# Patient Record
Sex: Female | Born: 1980 | Race: Black or African American | Hispanic: No | Marital: Single
Health system: Southern US, Community
[De-identification: ages and names within clinical notes are randomized; demographics above are authoritative.]

## PROBLEM LIST (undated history)

## (undated) DIAGNOSIS — E119 Type 2 diabetes mellitus without complications: Secondary | ICD-10-CM

## (undated) DIAGNOSIS — E109 Type 1 diabetes mellitus without complications: Secondary | ICD-10-CM

---

## 2020-03-20 ENCOUNTER — Emergency Department
Admission: EM | Admit: 2020-03-20 | Discharge: 2020-03-20 | Disposition: A | Discharge status: Home / Self Care | Payer: Medicaid Other | Attending: Emergency Medicine | Admitting: Emergency Medicine

## 2020-03-20 ENCOUNTER — Other Ambulatory Visit: Payer: Self-pay

## 2020-03-20 ENCOUNTER — Emergency Department
Admission: EM | Admit: 2020-03-20 | Discharge: 2020-03-20 | Disposition: A | Discharge status: Home / Self Care | Payer: Medicaid Other

## 2020-03-20 DIAGNOSIS — Z5321 Procedure and treatment not carried out due to patient leaving prior to being seen by health care provider: Secondary | ICD-10-CM | POA: Diagnosis not present

## 2020-03-20 DIAGNOSIS — R1030 Lower abdominal pain, unspecified: Secondary | ICD-10-CM | POA: Diagnosis not present

## 2020-03-20 DIAGNOSIS — Z3A01 Less than 8 weeks gestation of pregnancy: Secondary | ICD-10-CM | POA: Insufficient documentation

## 2020-03-20 DIAGNOSIS — R102 Pelvic and perineal pain: Secondary | ICD-10-CM

## 2020-03-20 DIAGNOSIS — O26891 Other specified pregnancy related conditions, first trimester: Secondary | ICD-10-CM | POA: Diagnosis present

## 2020-03-20 HISTORY — DX: Type 2 diabetes mellitus without complications: E11.9

## 2020-03-20 HISTORY — DX: Type 1 diabetes mellitus without complications: E10.9

## 2020-03-20 LAB — COMPREHENSIVE METABOLIC PANEL
ALT: 16 U/L (ref 0–44)
AST: 20 U/L (ref 15–41)
Albumin: 4.3 g/dL (ref 3.5–5.0)
Alkaline Phosphatase: 87 U/L (ref 38–126)
Anion gap: 8 (ref 5–15)
BUN: 12 mg/dL (ref 6–20)
CO2: 26 mmol/L (ref 22–32)
Calcium: 9.3 mg/dL (ref 8.9–10.3)
Chloride: 105 mmol/L (ref 98–111)
Creatinine, Ser: 0.78 mg/dL (ref 0.44–1.00)
GFR, Estimated: >60 mL/min (ref >60)
Glucose, Bld: 113 mg/dL — ABNORMAL HIGH (ref 70–99)
Potassium: 3.5 mmol/L (ref 3.5–5.1)
Sodium: 139 mmol/L (ref 135–145)
Total Bilirubin: 1.1 mg/dL (ref 0.3–1.2)
Total Protein: 8.3 g/dL — ABNORMAL HIGH (ref 6.5–8.1)

## 2020-03-20 LAB — URINALYSIS, COMPLETE (UACMP) WITH MICROSCOPIC
Bilirubin Urine: NEGATIVE
Glucose, UA: NEGATIVE mg/dL
Ketones, ur: NEGATIVE mg/dL
Leukocytes,Ua: NEGATIVE
Nitrite: NEGATIVE
Protein, ur: 100 mg/dL — AB
RBC / HPF: >50 RBC/hpf — ABNORMAL HIGH (ref 0–5)
Specific Gravity, Urine: 1.016 (ref 1.005–1.030)
pH: 8 (ref 5.0–8.0)

## 2020-03-20 LAB — CBC
HCT: 36.4 % (ref 36.0–46.0)
Hemoglobin: 11.6 g/dL — ABNORMAL LOW (ref 12.0–15.0)
MCH: 28.6 pg (ref 26.0–34.0)
MCHC: 31.9 g/dL (ref 30.0–36.0)
MCV: 89.9 fL (ref 80.0–100.0)
Platelets: 270 10*3/uL (ref 150–400)
RBC: 4.05 MIL/uL (ref 3.87–5.11)
RDW: 14.6 % (ref 11.5–15.5)
WBC: 5 10*3/uL (ref 4.0–10.5)
nRBC: 0 % (ref 0.0–0.2)

## 2020-03-20 LAB — LIPASE, BLOOD: Lipase: 20 U/L (ref 11–51)

## 2020-03-20 MED ORDER — FENTANYL CITRATE (PF) 100 MCG/2ML IJ SOLN
50.0000 ug | Freq: Once | INTRAMUSCULAR | Status: AC
  Administered 2020-03-20: 50 ug via INTRAVENOUS
  Filled 2020-03-20: qty 2

## 2020-03-20 NOTE — ED Notes (Signed)
Patient checked on, stated she wanted to leave. Another RN removed IV.

## 2020-03-20 NOTE — ED Notes (Signed)
Patient walked to nursing station requesting to be led to the exit.Patient still with IV. Advised first nurse who will check patient.

## 2020-03-20 NOTE — ED Notes (Signed)
Pt agreeable to dose of pain meds. Kpad aware.

## 2020-03-20 NOTE — ED Triage Notes (Signed)
First nurse note: Patient to ER from home via [facility] for c/o lower abd pain/suprapubic pain since yesterday. Patient is approx [redacted] weeks pregnant, developed spotting yesterday (heavier than spotting today). Patient reports to EMS this is 6th pregnancy (has 5 living children. Also has h/o cysts on ovaries and cervical CA. Due date = August 21st, 2022.

## 2020-03-20 NOTE — ED Notes (Signed)
Per pharmacy, this RN to waste fentenyl in sharps.   Jeannett Senior, RN to witness.   Pt not in pyxis anymore to waste under pyxis.

## 2020-03-20 NOTE — ED Triage Notes (Addendum)
See first nurse note. About [redacted] weeks pregnant. Pain is more on left side. Pain started after sex last night.

## 2020-03-22 ENCOUNTER — Telehealth: Payer: Self-pay | Admitting: Emergency Medicine

## 2020-03-22 NOTE — Telephone Encounter (Signed)
Called patient due to left emergency department before provider exam to inquire about condition and follow up plans. She still has pain.  No pcp.  I explained need for exam and that she can always come back here.

## 2020-04-05 ENCOUNTER — Ambulatory Visit
Admission: RE | Admit: 2020-04-05 | Discharge: 2020-04-05 | Disposition: A | Discharge status: Home / Self Care | Payer: Medicaid Other | Source: Ambulatory Visit | Attending: Family Medicine | Admitting: Family Medicine

## 2020-04-05 ENCOUNTER — Encounter: Payer: Self-pay | Admitting: Emergency Medicine

## 2020-04-05 ENCOUNTER — Other Ambulatory Visit: Payer: Self-pay

## 2020-04-05 ENCOUNTER — Ambulatory Visit
Admission: EM | Admit: 2020-04-05 | Discharge: 2020-04-05 | Disposition: A | Discharge status: Home / Self Care | Payer: Medicaid Other | Attending: Family Medicine | Admitting: Family Medicine

## 2020-04-05 DIAGNOSIS — N939 Abnormal uterine and vaginal bleeding, unspecified: Secondary | ICD-10-CM | POA: Diagnosis present

## 2020-04-05 DIAGNOSIS — N898 Other specified noninflammatory disorders of vagina: Secondary | ICD-10-CM | POA: Insufficient documentation

## 2020-04-05 DIAGNOSIS — Z1152 Encounter for screening for COVID-19: Secondary | ICD-10-CM | POA: Diagnosis not present

## 2020-04-05 DIAGNOSIS — R102 Pelvic and perineal pain: Secondary | ICD-10-CM | POA: Insufficient documentation

## 2020-04-05 DIAGNOSIS — R140 Abdominal distension (gaseous): Secondary | ICD-10-CM | POA: Diagnosis not present

## 2020-04-05 DIAGNOSIS — R350 Frequency of micturition: Secondary | ICD-10-CM | POA: Insufficient documentation

## 2020-04-05 DIAGNOSIS — E109 Type 1 diabetes mellitus without complications: Secondary | ICD-10-CM | POA: Diagnosis present

## 2020-04-05 LAB — POCT URINALYSIS DIP (MANUAL ENTRY)
Bilirubin, UA: NEGATIVE
Glucose, UA: NEGATIVE mg/dL
Ketones, POC UA: NEGATIVE mg/dL
Leukocytes, UA: NEGATIVE
Nitrite, UA: NEGATIVE
Protein Ur, POC: NEGATIVE mg/dL
Spec Grav, UA: 1.02 (ref 1.010–1.025)
Urobilinogen, UA: 0.2 E.U./dL
pH, UA: 5.5 (ref 5.0–8.0)

## 2020-04-05 LAB — POCT URINE PREGNANCY: Preg Test, Ur: NEGATIVE

## 2020-04-05 MED ORDER — FLUCONAZOLE 150 MG PO TABS: 150.0000 mg | ORAL_TABLET | Freq: Every day | ORAL | 0 refills | Status: AC

## 2020-04-05 MED ORDER — METRONIDAZOLE 0.75 % VA GEL: 1.0000 | Freq: Every day | VAGINAL | 0 refills | Status: AC

## 2020-04-05 MED ORDER — INSULIN DETEMIR 100 UNIT/ML FLEXPEN: 30.0000 [IU] | PEN_INJECTOR | Freq: Every day | SUBCUTANEOUS | 11 refills | Status: AC

## 2020-04-05 MED ORDER — METRONIDAZOLE 500 MG PO TABS: 500.0000 mg | ORAL_TABLET | Freq: Two times a day (BID) | ORAL | 0 refills | Status: DC

## 2020-04-05 MED ORDER — INSULIN ASPART 100 UNIT/ML FLEXPEN: 1.0000 [IU] | PEN_INJECTOR | Freq: Two times a day (BID) | SUBCUTANEOUS | 11 refills | Status: AC

## 2020-04-05 MED ORDER — HYDROCODONE-ACETAMINOPHEN 5-325 MG PO TABS: 1.0000 | ORAL_TABLET | Freq: Four times a day (QID) | ORAL | 0 refills | Status: DC | PRN

## 2020-04-05 NOTE — Discharge Instructions (Addendum)
You are going for an ultrasound today at 1:45, get there about 2:00 pm at [facility].  I will call you when I get the results We are sending a COVID swab for COVID testing and also sending a self swab for testing for BV, yeast, STDs. I have refilled your diabetes medication I am can you contact for primary care and for OB/GYN for follow-up

## 2020-04-05 NOTE — ED Triage Notes (Signed)
Pt presents today with lower abd pain, lower back pain and has sores to vaginal area. She was recently in ED for UTI and left before being discharged. Was dx with UTI and BV. She did not get antibiotic and symptoms have returned.

## 2020-04-05 NOTE — ED Provider Notes (Signed)
Renaldo Fiddler    CSN: [id number] Arrival date & time: 04/05/20  1111      History   Chief Complaint Chief Complaint  Patient presents with  . Abdominal Pain  . Back Pain    HPI Alisha Mitchell is a 40 y.o. female.   Patient is a 40 year old female with past medical history of type 1 diabetes.  She presents today with multiple complaints.  First complaint being lower abdominal pain, pressure, lower back pain.  This has been ongoing issue for the past couple months.  Symptoms are worsened.  She has had urinary frequency and incontinence.  She is also having irregular menstrual cycles with abnormal bleeding and missed periods.  Abdominal area and pelvic area feel very bloated and distended.  No dysuria, hematuria, fevers.  Is having vaginal discharge, itching and irritation.  Dyspareunia. Patient's last menstrual period was 02/04/2020. Concern for BV and yeast infection.  Patient is currently out of her diabetes medication.  Recently obtained health insurance and needs refills on her insulin.  Reports blood sugars have been up and down.  She was also exposed to covid and would like covid testing.       Past Medical History:  Diagnosis Date  . Diabetes mellitus without complication (HCC)   . Type 1 diabetes (HCC)     There are no problems to display for this patient.   History reviewed. No pertinent surgical history.  OB History    Gravida  1   Para      Term      Preterm      AB      Living        SAB      IAB      Ectopic      Multiple      Live Births               Home Medications    Prior to Admission medications   Medication Sig Start Date End Date Taking? Authorizing Provider  albuterol (VENTOLIN HFA) 108 (90 Base) MCG/ACT inhaler Inhale into the lungs. 09/16/19  Yes [provider]  fluconazole (DIFLUCAN) 150 MG tablet Take 1 tablet (150 mg total) by mouth daily. 04/05/20  Yes Hollie Wojahn A, NP  HYDROcodone-acetaminophen  (NORCO/VICODIN) 5-325 MG tablet Take 1-2 tablets by mouth every 6 (six) hours as needed. 04/05/20  Yes Kyndall Amero A, NP  insulin aspart (NOVOLOG) 100 UNIT/ML FlexPen Inject 1 Units into the skin 2 (two) times daily. 04/05/20  Yes Ericberto Padget A, NP  insulin detemir (LEVEMIR) 100 UNIT/ML FlexPen Inject 30 Units into the skin daily. 04/05/20  Yes Mikaili Flippin A, NP  metroNIDAZOLE (METROGEL) 0.75 % vaginal gel Place 1 Applicatorful vaginally at bedtime for 5 days. 04/05/20 04/10/20 Yes Daisean Brodhead A, NP  cyclobenzaprine (FLEXERIL) 10 MG tablet Take by mouth.    [provider]    Family History History reviewed. No pertinent family history.  Social History Social History   Tobacco Use  . Smoking status: Passive Smoke Exposure - Never Smoker  . Smokeless tobacco: Never Used  Substance Use Topics  . Alcohol use: Not Currently  . Drug use: Never     Allergies   Coconut (cocos nucifera) allergy skin test, Latex, Orange juice [orange oil], and Penicillins   Review of Systems Review of Systems   Physical Exam Triage Vital Signs ED Triage Vitals  Enc Vitals Group     BP 04/05/20 1126 139/78  Pulse Rate 04/05/20 1126 80     Resp 04/05/20 1126 18     Temp 04/05/20 1126 98.2 F (36.8 C)     Temp Source 04/05/20 1126 Oral     SpO2 04/05/20 1126 98 %     Weight 04/05/20 1127 130 lb 1.1 oz (59 kg)     Height --      Head Circumference --      Peak Flow --      Pain Score 04/05/20 1127 6     Pain Loc --      Pain Edu? --      Excl. in GC? --    No data found.  Updated Vital Signs BP 139/78 (BP Location: Left Arm)   Pulse 80   Temp 98.2 F (36.8 C) (Oral)   Resp 18   Wt 130 lb 1.1 oz (59 kg)   LMP 02/04/2020   SpO2 98%   Breastfeeding Unknown   BMI 26.27 kg/m   Visual Acuity Right Eye Distance:   Left Eye Distance:   Bilateral Distance:    Right Eye Near:   Left Eye Near:    Bilateral Near:     Physical Exam Vitals and nursing note reviewed.   Constitutional:      General: She is not in acute distress.    Appearance: Normal appearance. She is not ill-appearing, toxic-appearing or diaphoretic.  HENT:     Head: Normocephalic.     Nose: Nose normal.     Mouth/Throat:     Pharynx: Oropharynx is clear.  Eyes:     Conjunctiva/sclera: Conjunctivae normal.  Pulmonary:     Effort: Pulmonary effort is normal.  Abdominal:     General: Abdomen is protuberant. There is distension.     Palpations: Abdomen is soft.     Tenderness: There is abdominal tenderness.     Comments: Distended abdomen. Very firm. TTP generalized.   Genitourinary:    Vagina: Vaginal discharge present.     Comments: White vaginal discharge noted to external vaginal area. Small sore to right labia majora. Skin tag.  Internal exam deferred.  Musculoskeletal:        General: Normal range of motion.     Cervical back: Normal range of motion.  Skin:    General: Skin is warm and dry.     Findings: No rash.  Neurological:     Mental Status: She is alert.  Psychiatric:        Mood and Affect: Mood normal.      UC Treatments / Results  Labs (all labs ordered are listed, but only abnormal results are displayed) Labs Reviewed  POCT URINALYSIS DIP (MANUAL ENTRY) - Abnormal; Notable for the following components:      Result Value   Blood, UA small (*)    All other components within normal limits  NOVEL CORONAVIRUS, NAA  POCT URINE PREGNANCY  CERVICOVAGINAL ANCILLARY ONLY    EKG   Radiology No results found.  Procedures Procedures (including critical care time)  Medications Ordered in UC Medications - No data to display  Initial Impression / Assessment and Plan / UC Course  I have reviewed the triage vital signs and the nursing notes.  Pertinent labs & imaging results that were available during my care of the patient were reviewed by me and considered in my medical decision making (see chart for details).     Pelvic pain with abdominal  bloating.  This has been ongoing and worse.  Distended form abdomen. There is concern today for fibroid tumor. Pt appears to be approximated 6 months pregnant. Pregnancy test negative. Sending for ultrasound.  Contact given for OB/GYN for follow up.  Due to the vaginal discharge and concern we will treat for BV and yeast.  Swab sent for testing.  Urine did not show infection today.  Hydrocodone for pain as needed.   Exposure to covid covid test pending.   Type 1 diabetes Refilled her novolog and Levemir pens.  Contact given for primary care for follow up.   Final Clinical Impressions(s) / UC Diagnoses   Final diagnoses:  Abdominal bloating  Vaginal discharge  Urinary frequency  Abnormal uterine bleeding (AUB)  Type 1 diabetes mellitus without complication (HCC)  Pelvic pain  Encounter for screening for COVID-19     Discharge Instructions     You are going for an ultrasound today at 1:45, get there about 2:00 pm at [facility].  I will call you when I get the results We are sending a COVID swab for COVID testing and also sending a self swab for testing for BV, yeast, STDs. I have refilled your diabetes medication I am can you contact for primary care and for OB/GYN for follow-up      ED Prescriptions    Medication Sig Dispense Auth. Provider   insulin aspart (NOVOLOG) 100 UNIT/ML FlexPen Inject 1 Units into the skin 2 (two) times daily. 15 mL Ayinde Swim A, NP   insulin detemir (LEVEMIR) 100 UNIT/ML FlexPen Inject 30 Units into the skin daily. 15 mL Yanin Muhlestein A, NP   metroNIDAZOLE (FLAGYL) 500 MG tablet  (Status: Discontinued) Take 1 tablet (500 mg total) by mouth 2 (two) times daily. 14 tablet Jonathan Corpus A, NP   fluconazole (DIFLUCAN) 150 MG tablet Take 1 tablet (150 mg total) by mouth daily. 2 tablet Brandon Scarbrough A, NP   HYDROcodone-acetaminophen (NORCO/VICODIN) 5-325 MG tablet Take 1-2 tablets by mouth every 6 (six) hours as needed. 12 tablet Akasha Melena A, NP    metroNIDAZOLE (METROGEL) 0.75 % vaginal gel Place 1 Applicatorful vaginally at bedtime for 5 days. 50 g Dahlia Byes A, NP     I have reviewed the PDMP during this encounter.   Dahlia Byes A, NP 04/05/20 1409

## 2020-04-06 LAB — CERVICOVAGINAL ANCILLARY ONLY
Bacterial Vaginitis (gardnerella): NEGATIVE
Candida Glabrata: NEGATIVE
Candida Vaginitis: NEGATIVE
Chlamydia: NEGATIVE
Comment: NEGATIVE
Comment: NEGATIVE
Comment: NEGATIVE
Comment: NEGATIVE
Comment: NEGATIVE
Comment: NORMAL
Neisseria Gonorrhea: NEGATIVE
Trichomonas: NEGATIVE

## 2020-04-07 LAB — NOVEL CORONAVIRUS, NAA: SARS-CoV-2, NAA: NOT DETECTED

## 2020-04-07 LAB — SARS-COV-2, NAA 2 DAY TAT

## 2020-07-31 ENCOUNTER — Observation Stay
Admission: EM | Admit: 2020-07-31 | Discharge: 2020-07-31 | Disposition: A | Discharge status: Home / Self Care | Payer: Medicaid Other | Attending: Obstetrics and Gynecology | Admitting: Obstetrics and Gynecology

## 2020-07-31 DIAGNOSIS — O30102 Triplet pregnancy, unspecified number of placenta and unspecified number of amniotic sacs, second trimester: Principal | ICD-10-CM | POA: Insufficient documentation

## 2020-07-31 DIAGNOSIS — Z3A24 24 weeks gestation of pregnancy: Secondary | ICD-10-CM | POA: Diagnosis not present

## 2020-07-31 DIAGNOSIS — O4692 Antepartum hemorrhage, unspecified, second trimester: Secondary | ICD-10-CM | POA: Diagnosis not present

## 2020-07-31 DIAGNOSIS — Z91040 Latex allergy status: Secondary | ICD-10-CM | POA: Diagnosis not present

## 2020-07-31 LAB — PREGNANCY, URINE: Preg Test, Ur: POSITIVE — AB

## 2020-07-31 MED ORDER — ACETAMINOPHEN 325 MG PO TABS: 650.0000 mg | ORAL_TABLET | ORAL | Status: DC | PRN

## 2020-07-31 NOTE — H&P (Addendum)
Obstetric H&P   Chief Complaint: Bleeding, triplet pregnancy  Prenatal Care Provider: Unclear  History of Present Illness: 40 y.o. G2P0 [redacted]w[redacted]d by 11/16/2020, by Last Menstrual Period presenting to L&D reporting a previously diagnosed triplet pregnancy with abdominal pain and vaginal bleeding.  She also gave a second due date in September.  Reports prior care in [facility] but on contacting that facility they do not provide prenatal care.  She was noted to have vaginal bleeding on presentation.  Ultrasound 04/05/2020 showed a normal uterus and no pregnancy. So even if that ultrasound was used as her LMP she would be at most [redacted] week gestation.  She refused to get on a gown and appeared to be wearing something underneath her cloths.  She declined ultrasound evaluation as I presented to perform a bedside ultrasound.  She declined exam or bedside ultrasound and elected to sign out AMA    Review of Systems: 10 point review of systems negative unless otherwise noted in HPI  Past Medical History: Patient Active Problem List   Diagnosis Date Noted  . Labor and delivery indication for care or intervention 07/31/2020    Past Surgical History: No past surgical history on file.  Past Obstetric History: # 1 - Date: None, Sex: None, Weight: None, GA: None, Delivery: None, Apgar1: None, Apgar5: None, Living: None, Birth Comments: None  # 2 - Date: None, Sex: None, Weight: None, GA: None, Delivery: None, Apgar1: None, Apgar5: None, Living: None, Birth Comments: None   Past Gynecologic History:  Family History: No family history on file.  Social History: Social History   Socioeconomic History  . Marital status: Single    Spouse name: Not on file  . Number of children: Not on file  . Years of education: Not on file  . Highest education level: Not on file  Occupational History  . Not on file  Tobacco Use  . Smoking status: Passive Smoke Exposure - Never Smoker  . Smokeless  tobacco: Never Used  Substance and Sexual Activity  . Alcohol use: Not Currently  . Drug use: Never  . Sexual activity: Yes  Other Topics Concern  . Not on file  Social History Narrative  . Not on file   Social Determinants of Health   Financial Resource Strain: Not on file  Food Insecurity: Not on file  Transportation Needs: Not on file  Physical Activity: Not on file  Stress: Not on file  Social Connections: Not on file  Intimate Partner Violence: Not on file    Medications: Prior to Admission medications   Medication Sig Start Date End Date Taking? Authorizing Provider  albuterol (VENTOLIN HFA) 108 (90 Base) MCG/ACT inhaler Inhale into the lungs. 09/16/19   [provider]  cyclobenzaprine (FLEXERIL) 10 MG tablet Take by mouth.    [provider]  fluconazole (DIFLUCAN) 150 MG tablet Take 1 tablet (150 mg total) by mouth daily. 04/05/20   Dahlia Byes A, NP  HYDROcodone-acetaminophen (NORCO/VICODIN) 5-325 MG tablet Take 1-2 tablets by mouth every 6 (six) hours as needed. 04/05/20   Dahlia Byes A, NP  insulin aspart (NOVOLOG) 100 UNIT/ML FlexPen Inject 1 Units into the skin 2 (two) times daily. 04/05/20   Dahlia Byes A, NP  insulin detemir (LEVEMIR) 100 UNIT/ML FlexPen Inject 30 Units into the skin daily. 04/05/20   Janace Aris, NP    Allergies: Allergies  Allergen Reactions  . Coconut (Cocos Nucifera) Allergy Skin Test   . Latex   .  Orange Juice [Orange Oil]   . Penicillins     Physical Exam: Vitals: Last menstrual period 02/10/2020, unknown if currently breastfeeding.  FHT:  Declined  Declined physical exam  Labs: No results found for this or any previous visit (from the past 24 hour(s)).  Assessment: 40 y.o. G2P0 [redacted]w[redacted]d by 11/16/2020, by Last Menstrual Period suspected pseudocyesis   Plan: 1) Pseudocyesis - declined ultrasound evaluation, pregnancy test was sent prior to patient signing out AMA  2) Fetus - declined evaluation  3) Disposition  - signed out AMA  Vena Austria, MD, [facility], [facility] 07/31/2020, 10:54 AM    Addendum:  Urine pregnancy test was positive.   Vena Austria, MD, [facility], [facility] 07/31/2020, 11:15 AM

## 2020-07-31 NOTE — Discharge Summary (Signed)
RN at the bedside to triage patient who reports she is "pregnant with triplets". RN gave the patient a urine specimen cup and a gown and asked her to change her clothes. Pt reports needing extra time because she has had diarrhea all morning. Pt exits the restroom still fully clothed in her clothes and hands RN the urine specimen cup that appears to be bloody. RN asks the pt if she is bleeding, in which the patient proceeds to show the patient a blood saturated peri pad. Pt states her "urine has been bloody for a week and a half and it burns when I pee and my husband thinks I have a UTI". She also tells RN that "my husband called and said we are at the wrong hospital, and we should be at [facility] in [place]". Pt states "I want to leave [facility]". Provider at the bedside to do a bedside ultrasound to provide a medical screening for the patient. Pt refused to let the provider do a bedside ultrasound. RN to explain to pt the right to medical screening and that if she were to leave, she would be leaving against medical advice. Pt verbalized understanding. Pt signed that she understood the right to medical screening and that she wanted to leave against medical advice. Pt then wheeled down to the ED by our unit tech and RN.

## 2020-12-24 ENCOUNTER — Telehealth: Payer: Medicaid Other | Admitting: Emergency Medicine

## 2020-12-24 ENCOUNTER — Ambulatory Visit: Payer: Self-pay

## 2020-12-24 ENCOUNTER — Telehealth: Payer: Self-pay | Admitting: Surgery

## 2020-12-24 DIAGNOSIS — A6000 Herpesviral infection of urogenital system, unspecified: Secondary | ICD-10-CM

## 2020-12-24 MED ORDER — VALACYCLOVIR HCL 1 G PO TABS: 2000.0000 mg | ORAL_TABLET | Freq: Two times a day (BID) | ORAL | 0 refills | Status: AC

## 2020-12-24 MED ORDER — LIDOCAINE 5 % EX OINT: 1.0000 "application " | TOPICAL_OINTMENT | CUTANEOUS | 0 refills | Status: AC | PRN

## 2020-12-24 NOTE — Progress Notes (Signed)
Virtual Visit Consent   Alisha Mitchell, you are scheduled for a virtual visit with a [facility] provider today.     Just as with appointments in the office, your consent must be obtained to participate.  Your consent will be active for this visit and any virtual visit you may have with one of our providers in the next 365 days.     If you have a MyChart account, a copy of this consent can be sent to you electronically.  All virtual visits are billed to your insurance company just like a traditional visit in the office.    As this is a virtual visit, video technology does not allow for your provider to perform a traditional examination.  This may limit your provider's ability to fully assess your condition.  If your provider identifies any concerns that need to be evaluated in person or the need to arrange testing (such as labs, EKG, etc.), we will make arrangements to do so.     Although advances in technology are sophisticated, we cannot ensure that it will always work on either your end or our end.  If the connection with a video visit is poor, the visit may have to be switched to a telephone visit.  With either a video or telephone visit, we are not always able to ensure that we have a secure connection.     I need to obtain your verbal consent now.   Are you willing to proceed with your visit today?    Kelsy Polack has provided verbal consent on 12/24/2020 for a virtual visit video.   Roxy Horseman, PA-C   Date: 12/24/2020 2:57 PM   Virtual Visit via Video Note   I, Roxy Horseman, connected with  Alisha Mitchell  ([id number], 12/07/1980) on 12/24/20 at  3:00 PM EDT by a video-enabled telemedicine application and verified that I am speaking with the correct person using two identifiers.  Location: Patient: Virtual Visit Location Patient: Mobile Provider: Virtual Visit Location Provider: Home Office   I discussed the limitations of evaluation and management by telemedicine and  the availability of in person appointments. The patient expressed understanding and agreed to proceed.    Poor connection with video.  Switched to telephone.  History of Present Illness: Alisha Mitchell is a 40 y.o. who identifies as a female who was assigned female at birth, and is being seen today for genital herpes and toothache.  She has hx of genital herpes, but doesn't have any medications to treat it.  Has also had good relief with lidocaine jelly.    She also complains of a toothache and broken tooth.  Has tried treatment with OTC meds and antibiotics, but no relief.  HPI: HPI  Problems: There are no problems to display for this patient.   Allergies: Not on File Medications: No current outpatient medications on file.  Observations/Objective: Patient is well-developed, well-nourished in no acute distress.  Walking outside Head is normocephalic, atraumatic.  No labored breathing.  Speech is clear and coherent with logical content.  Patient is alert and oriented at baseline.    Assessment and Plan: 1. Genital herpes simplex, unspecified site Valtrex 2000mg  BID x 1 day Lidocaine jelly Dental follow-up for toothache  Follow Up Instructions: I discussed the assessment and treatment plan with the patient. The patient was provided an opportunity to ask questions and all were answered. The patient agreed with the plan and demonstrated an understanding of the instructions.  A copy of instructions were  sent to the patient via MyChart unless otherwise noted below.     The patient was advised to call back or seek an in-person evaluation if the symptoms worsen or if the condition fails to improve as anticipated.  Time:  I spent 12 minutes with the patient via telehealth technology discussing the above problems/concerns.    Roxy Horseman, PA-C

## 2020-12-24 NOTE — Telephone Encounter (Signed)
Patient called ED RNCM because prescriptions were not received at her local [facility]. ED RNCM reviewed chart and noted prescription which were called in to [facility] in [place], patient was updated.

## 2020-12-26 ENCOUNTER — Emergency Department
Admission: EM | Admit: 2020-12-26 | Discharge: 2020-12-26 | Disposition: A | Discharge status: Home / Self Care | Payer: Medicaid Other | Attending: Emergency Medicine | Admitting: Emergency Medicine

## 2020-12-26 ENCOUNTER — Encounter: Payer: Self-pay | Admitting: Emergency Medicine

## 2020-12-26 ENCOUNTER — Ambulatory Visit: Payer: Medicaid Other

## 2020-12-26 ENCOUNTER — Other Ambulatory Visit: Payer: Self-pay

## 2020-12-26 DIAGNOSIS — K0889 Other specified disorders of teeth and supporting structures: Secondary | ICD-10-CM | POA: Insufficient documentation

## 2020-12-26 DIAGNOSIS — E109 Type 1 diabetes mellitus without complications: Secondary | ICD-10-CM | POA: Insufficient documentation

## 2020-12-26 DIAGNOSIS — Z91040 Latex allergy status: Secondary | ICD-10-CM | POA: Diagnosis not present

## 2020-12-26 DIAGNOSIS — Z7722 Contact with and (suspected) exposure to environmental tobacco smoke (acute) (chronic): Secondary | ICD-10-CM | POA: Insufficient documentation

## 2020-12-26 DIAGNOSIS — Z794 Long term (current) use of insulin: Secondary | ICD-10-CM | POA: Diagnosis not present

## 2020-12-26 MED ORDER — OXYCODONE-ACETAMINOPHEN 5-325 MG PO TABS: 1.0000 | ORAL_TABLET | ORAL | 0 refills | Status: AC | PRN

## 2020-12-26 MED ORDER — ONDANSETRON 4 MG PO TBDP: 4.0000 mg | ORAL_TABLET | Freq: Three times a day (TID) | ORAL | 0 refills | Status: AC | PRN

## 2020-12-26 NOTE — ED Provider Notes (Signed)
[facility] Emergency Department Provider Note  ____________________________________________   Event Date/Time   First MD Initiated Contact with Patient 12/26/20 1359     (approximate)  I have reviewed the triage vital signs and the nursing notes.   HISTORY  Chief Complaint Dental Pain    HPI Alisha Mitchell is a 40 y.o. female presents emergency department complaint of dental pain.  Patient was seen at her dentist office but he stated he could not write for narcotics.  States that she has an infection in the tooth that may have gone into the bone.  Placed her on amoxicillin.  She is to follow-up with a oral surgeon next week to have the areas addressed.  States she has diabetes and has been unable to eat or drink due to the pain.  Does not feel like she is going into DKA.  Past Medical History:  Diagnosis Date   Diabetes mellitus without complication (HCC)    Type 1 diabetes (HCC)     Patient Active Problem List   Diagnosis Date Noted   Labor and delivery indication for care or intervention 07/31/2020    History reviewed. No pertinent surgical history.  Prior to Admission medications   Medication Sig Start Date End Date Taking? Authorizing Provider  ondansetron (ZOFRAN-ODT) 4 MG disintegrating tablet Take 1 tablet (4 mg total) by mouth every 8 (eight) hours as needed. 12/26/20  Yes Kemper Heupel, Roselyn Bering, PA-C  oxyCODONE-acetaminophen (PERCOCET) 5-325 MG tablet Take 1 tablet by mouth every 4 (four) hours as needed for severe pain. 12/26/20 12/26/21 Yes Randolf Sansoucie, Roselyn Bering, PA-C  albuterol (VENTOLIN HFA) 108 (90 Base) MCG/ACT inhaler Inhale into the lungs. 09/16/19   [provider]  cyclobenzaprine (FLEXERIL) 10 MG tablet Take by mouth.    [provider]  fluconazole (DIFLUCAN) 150 MG tablet Take 1 tablet (150 mg total) by mouth daily. 04/05/20   Dahlia Byes A, NP  insulin aspart (NOVOLOG) 100 UNIT/ML FlexPen Inject 1 Units into the skin 2 (two)  times daily. 04/05/20   Dahlia Byes A, NP  insulin detemir (LEVEMIR) 100 UNIT/ML FlexPen Inject 30 Units into the skin daily. 04/05/20   Janace Aris, NP    Allergies Coconut (cocos nucifera) allergy skin test, Latex, Orange juice [orange oil], and Penicillins  History reviewed. No pertinent family history.  Social History Social History   Tobacco Use   Smoking status: Passive Smoke Exposure - Never Smoker   Smokeless tobacco: Never  Substance Use Topics   Alcohol use: Not Currently   Drug use: Never    Review of Systems  Constitutional: No fever/chills Eyes: No visual changes. ENT: No sore throat. Respiratory: Denies cough Cardiovascular: Denies chest pain Gastrointestinal: Denies abdominal pain Genitourinary: Negative for dysuria. Musculoskeletal: Negative for back pain. Skin: Negative for rash. Psychiatric: no mood changes,     ____________________________________________   PHYSICAL EXAM:  VITAL SIGNS: ED Triage Vitals  Enc Vitals Group     BP 12/26/20 1216 131/89     Pulse Rate 12/26/20 1216 80     Resp 12/26/20 1216 16     Temp 12/26/20 1216 98.4 F (36.9 C)     Temp Source 12/26/20 1216 Oral     SpO2 12/26/20 1216 98 %     Weight 12/26/20 1215 130 lb 1.1 oz (59 kg)     Height 12/26/20 1215 4\' 11"  (1.499 m)     Head Circumference --      Peak Flow --  Pain Score 12/26/20 1214 10     Pain Loc --      Pain Edu? --      Excl. in GC? --     Constitutional: Alert and oriented. Well appearing and in no acute distress. Eyes: Conjunctivae are normal.  Head: Atraumatic.  Left mandible tender to palpation, no swelling noted Nose: No congestion/rhinnorhea.  Mouth/Throat: Mucous membranes are moist.  Positive for poor dentition Neck:  supple no lymphadenopathy noted Cardiovascular: Normal rate, regular rhythm. Heart sounds are normal Respiratory: Normal respiratory effort.  No retractions, lungs c t a  GU: deferred Musculoskeletal: FROM all  extremities, warm and well perfused Neurologic:  Normal speech and language.  Skin:  Skin is warm, dry and intact. No rash noted. Psychiatric: Mood and affect are normal. Speech and behavior are normal.  ____________________________________________   LABS (all labs ordered are listed, but only abnormal results are displayed)  Labs Reviewed - No data to display ____________________________________________   ____________________________________________  RADIOLOGY    ____________________________________________   PROCEDURES  Procedure(s) performed: No  Procedures    ____________________________________________   INITIAL IMPRESSION / ASSESSMENT AND PLAN / ED COURSE  Pertinent labs & imaging results that were available during my care of the patient were reviewed by me and considered in my medical decision making (see chart for details).   The patient is a 40 year old female presents emergency department with dental pain.  See HPI.  Physical exam shows patient appears stable.  Patient has a prescription for amoxicillin from her dentist.  I sent in a prescription for Percocet.  She had a low PDMP score and had not had any narcotics since January 2021.  She is to keep her dental appointment with the oral surgeon.  Return emergency department worsening.  Discharged in stable condition.     Alisha Mitchell was evaluated in Emergency Department on 12/26/2020 for the symptoms described in the history of present illness. She was evaluated in the context of the global COVID-19 pandemic, which necessitated consideration that the patient might be at risk for infection with the SARS-CoV-2 virus that causes COVID-19. Institutional protocols and algorithms that pertain to the evaluation of patients at risk for COVID-19 are in a state of rapid change based on information released by regulatory bodies including the CDC and federal and state organizations. These policies and algorithms were  followed during the patient's care in the ED.    As part of my medical decision making, I reviewed the following data within the electronic MEDICAL RECORD NUMBER Nursing notes reviewed and incorporated, Old chart reviewed, Notes from prior ED visits, and [place] Controlled Substance Database  ____________________________________________   FINAL CLINICAL IMPRESSION(S) / ED DIAGNOSES  Final diagnoses:  Pain, dental      NEW MEDICATIONS STARTED DURING THIS VISIT:  Discharge Medication List as of 12/26/2020  2:00 PM     START taking these medications   Details  ondansetron (ZOFRAN-ODT) 4 MG disintegrating tablet Take 1 tablet (4 mg total) by mouth every 8 (eight) hours as needed., Starting Wed 12/26/2020, Normal    oxyCODONE-acetaminophen (PERCOCET) 5-325 MG tablet Take 1 tablet by mouth every 4 (four) hours as needed for severe pain., Starting Wed 12/26/2020, Until Thu 12/26/2021 at 2359, Normal         Note:  This document was prepared using Dragon voice recognition software and may include unintentional dictation errors.    Faythe Ghee, PA-C 12/26/20 1438    Dionne Bucy, MD 12/26/20 1525

## 2020-12-26 NOTE — ED Triage Notes (Signed)
Pt comes into the ED via POV c/o left lower dental pain x 10 days.  Pt in NAD at this time with even and unlabored respirations.

## 2022-04-12 IMAGING — US US PELVIS COMPLETE WITH TRANSVAGINAL
1 series · 14 of 25 positions shown · non-contrast
Comparison: None.

CLINICAL DATA: Irregular, heavy menses and bloating for the past
several months. Evaluate for fibroids.

EXAM:
TRANSABDOMINAL AND TRANSVAGINAL ULTRASOUND OF PELVIS
TECHNIQUE: Both transabdominal and transvaginal ultrasound examinations of the
pelvis were performed. Transabdominal technique was performed for
global imaging of the pelvis including uterus, ovaries, adnexal
regions, and pelvic cul-de-sac. It was necessary to proceed with
endovaginal exam following the transabdominal exam to visualize the
endometrium and ovaries.

[Series 1: us pelvis complete with transvaginal · 0.22mm/px · 99 acquisitions, 14 frames shown]
[im 1/99]
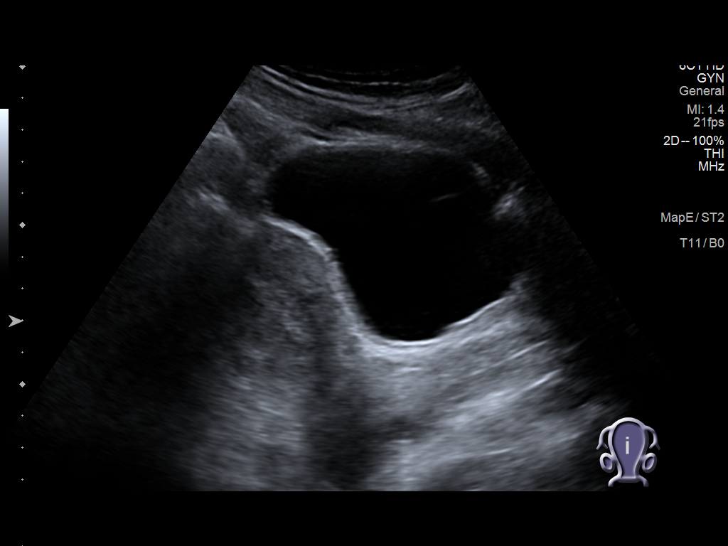
[im 9/99]
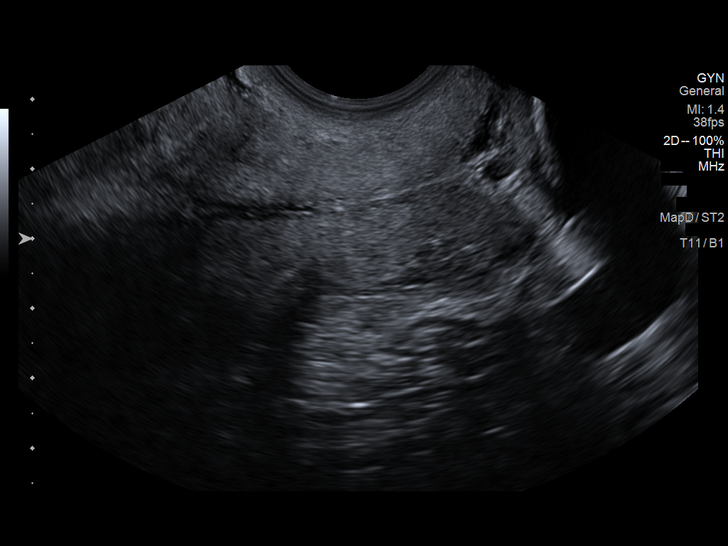
[im 17/99]
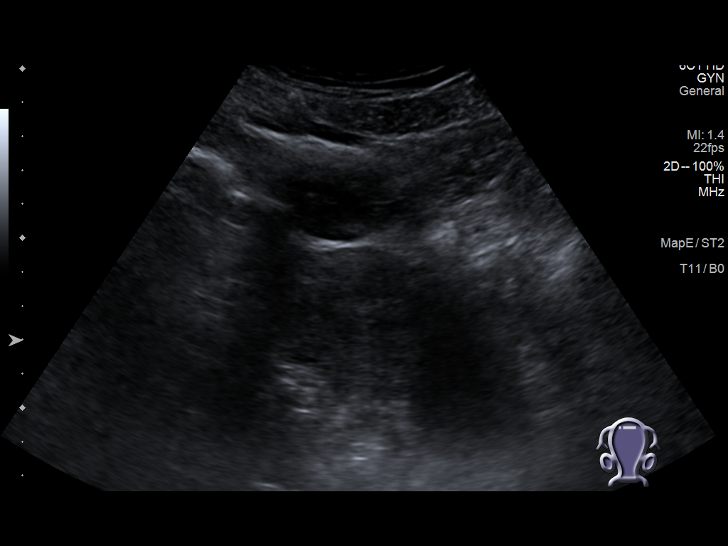
[im 25/99]
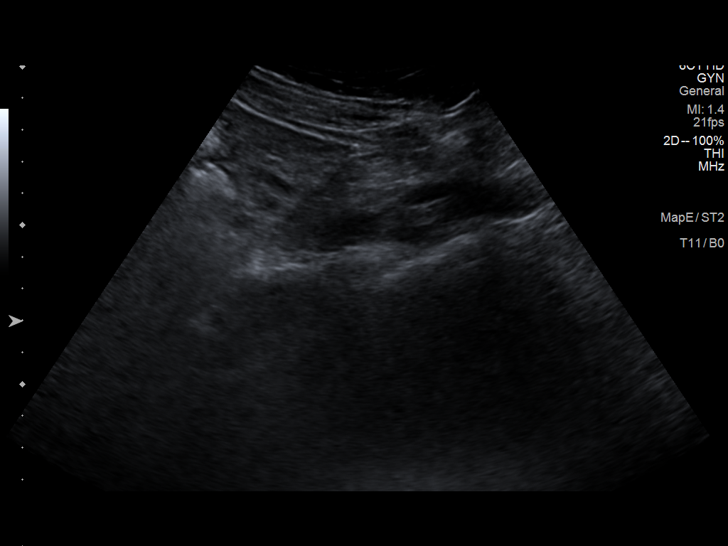
[im 33/99]
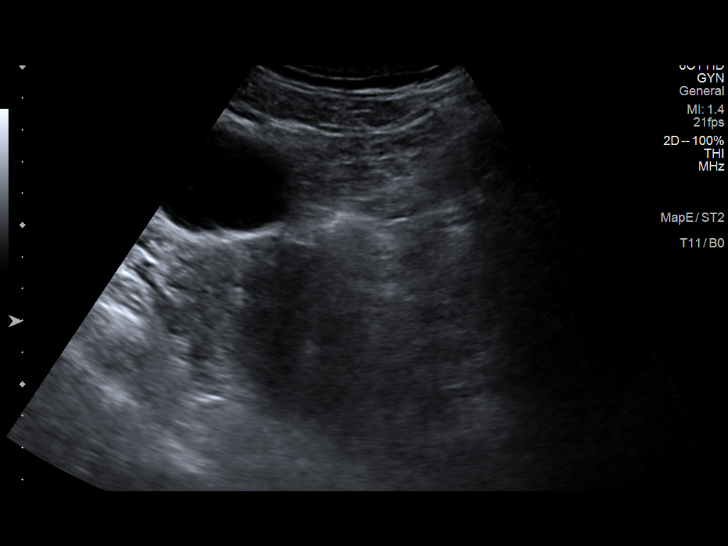
[im 37/99]
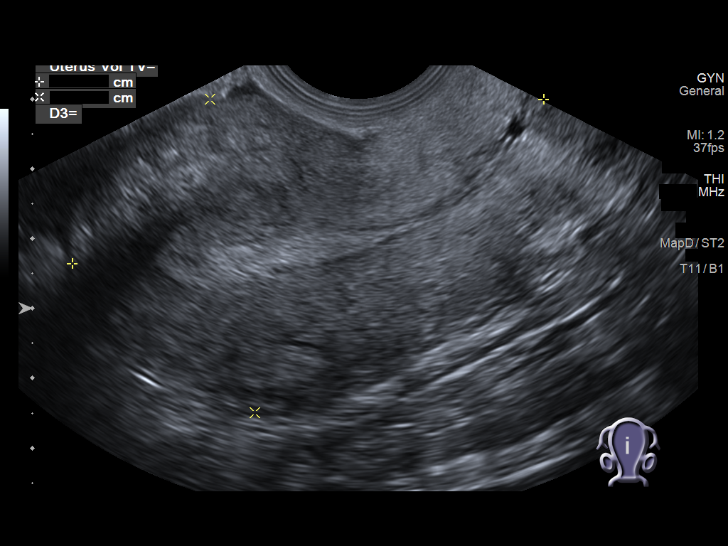
[im 45/99]
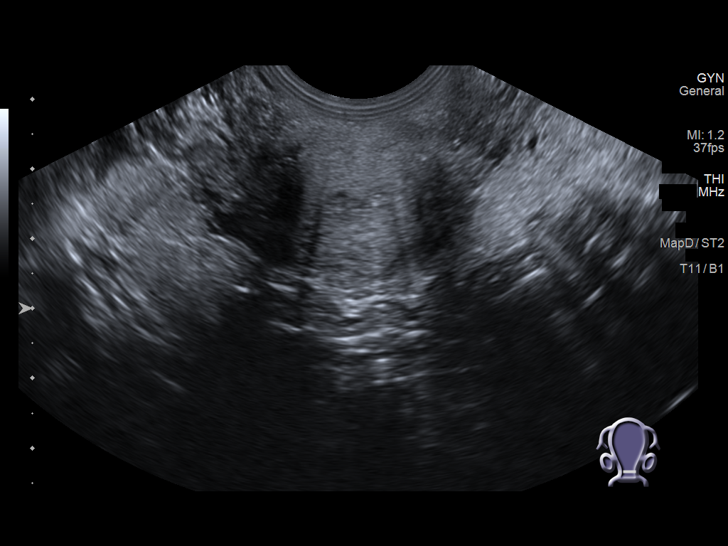
[im 54/99]
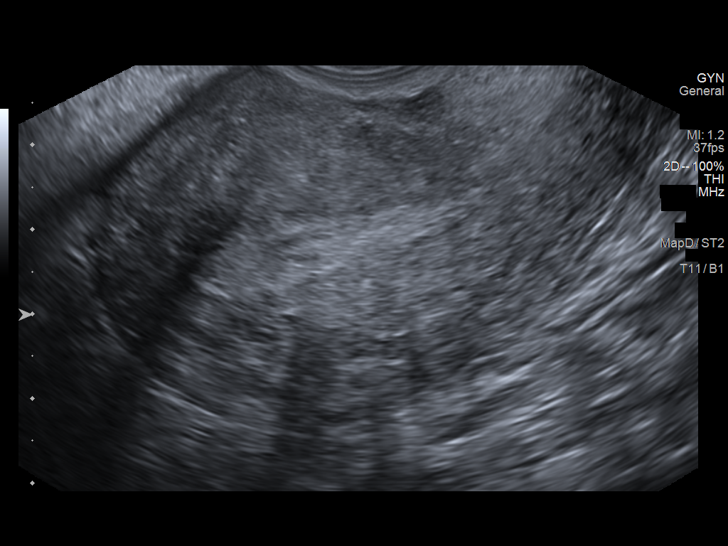
[im 62/99]
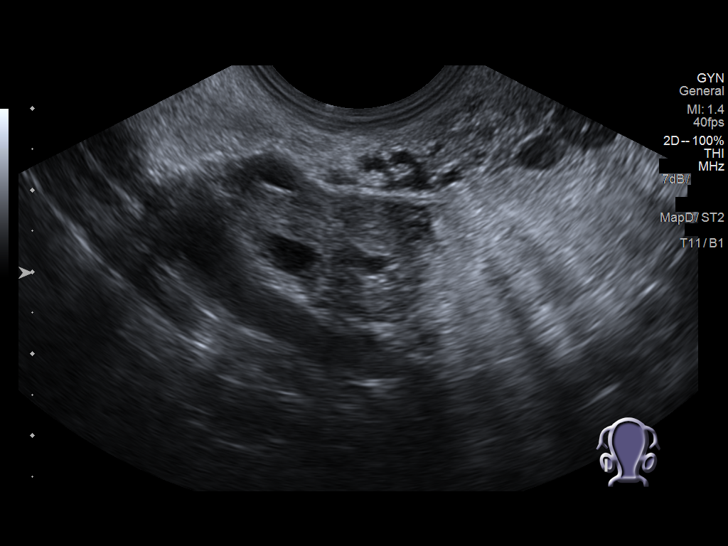
[im 66/99]
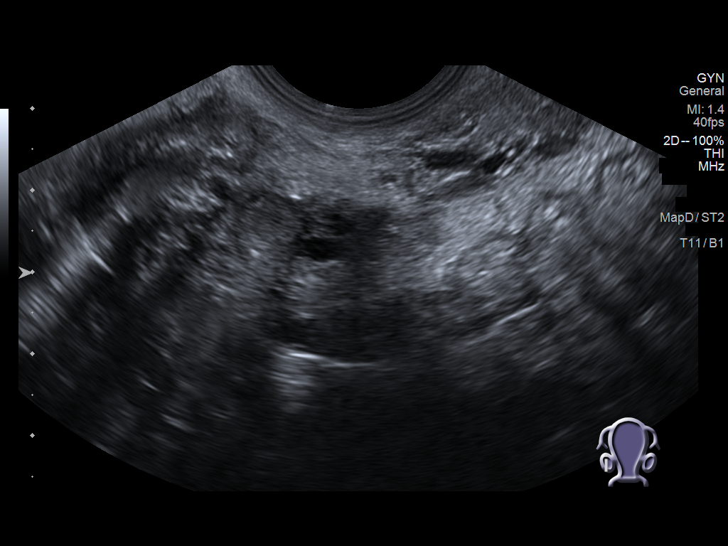
[im 74/99]
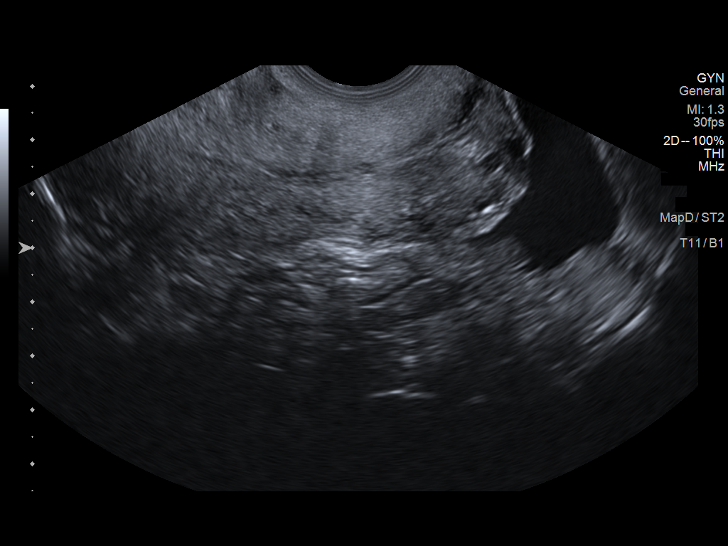
[im 82/99]
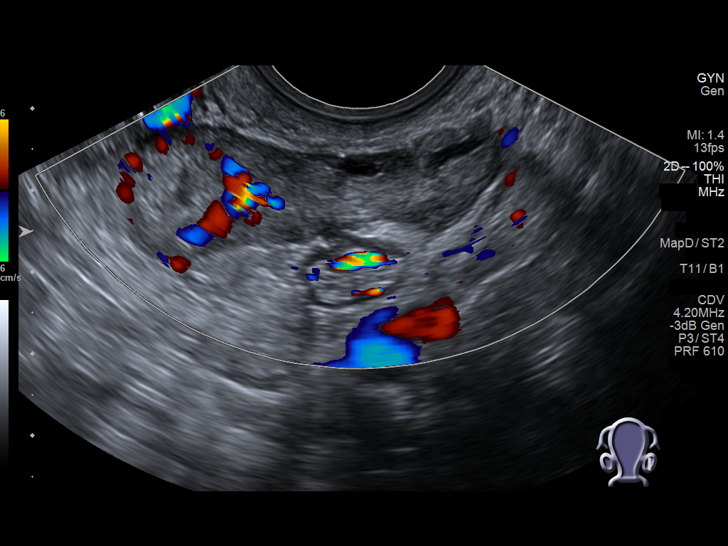
[im 90/99]
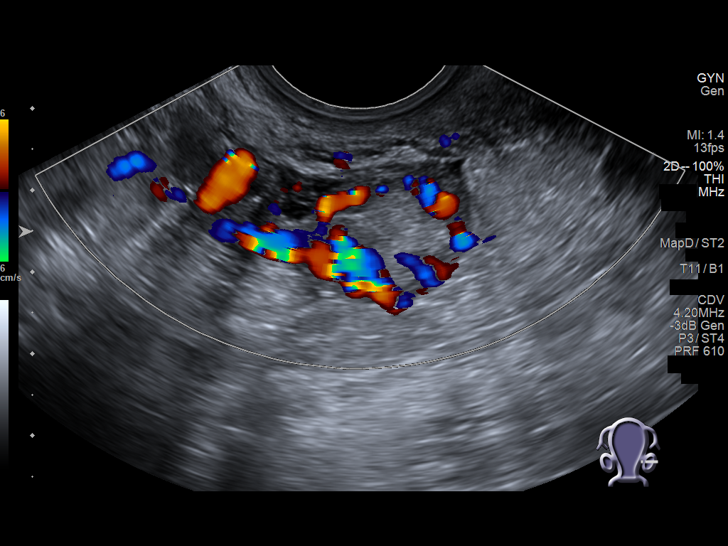
[im 99/99]
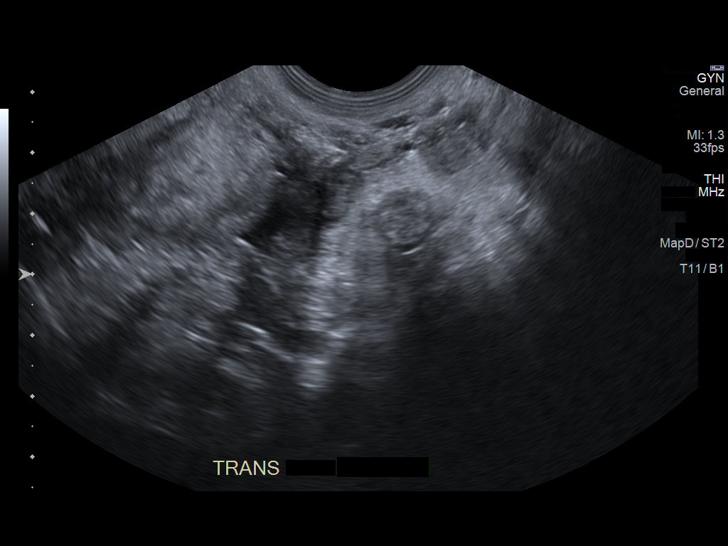

[14 of 25 positions shown; findings below may reference images not displayed]

FINDINGS: Uterus

Measurements: 8.9 x 4.2 x 5.7 cm = volume: 114 mL. Heterogeneous
appearance. No fibroids or other mass visualized.

Endometrium

Thickness: 9 mm.  No focal abnormality visualized.

Right ovary

Measurements: 2.8 x 1.7 x 1.5 cm = volume: 4 mL. Normal
appearance/no adnexal mass.

Left ovary

Measurements: 4.7 x 1.6 x 2.5 cm = volume: 10 mL. Normal
appearance/no adnexal mass. Corpus luteum.

Other findings

Small amount of free fluid in the pelvis, likely physiologic.
IMPRESSION: 1. Unremarkable pelvic ultrasound. No fibroids visualized.
